# Patient Record
Sex: Female | Born: 1990 | Hispanic: Yes | Marital: Single
Health system: Midwestern US, Community
[De-identification: ages and names within clinical notes are randomized; demographics above are authoritative.]

## PROBLEM LIST (undated history)

## (undated) DIAGNOSIS — F329 Major depressive disorder, single episode, unspecified: Secondary | ICD-10-CM

## (undated) DIAGNOSIS — F419 Anxiety disorder, unspecified: Secondary | ICD-10-CM

## (undated) DIAGNOSIS — F319 Bipolar disorder, unspecified: Secondary | ICD-10-CM

## (undated) DIAGNOSIS — F32A Depression, unspecified: Secondary | ICD-10-CM

## (undated) HISTORY — DX: Depression, unspecified: F32.A

## (undated) HISTORY — PX: TONSILLECTOMY AND ADENOIDECTOMY: SHX28

## (undated) HISTORY — DX: Major depressive disorder, single episode, unspecified: F32.9

---
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
## 2009-11-17 ENCOUNTER — Ambulatory Visit: Payer: Self-pay | Admitting: Internal Medicine

## 2009-11-17 DIAGNOSIS — F329 Major depressive disorder, single episode, unspecified: Secondary | ICD-10-CM

## 2009-11-17 DIAGNOSIS — F3289 Other specified depressive episodes: Secondary | ICD-10-CM | POA: Insufficient documentation

## 2009-11-21 LAB — CONVERTED CEMR LAB
ALT: 12 units/L (ref 0–35)
AST: 16 units/L (ref 0–37)
Albumin: 4.2 g/dL (ref 3.5–5.2)
Alkaline Phosphatase: 80 units/L (ref 39–117)
BUN: 12 mg/dL (ref 6–23)
Basophils Absolute: 0 10*3/uL (ref 0.0–0.1)
Basophils Relative: 0.3 % (ref 0.0–3.0)
Bilirubin, Direct: 0.1 mg/dL (ref 0.0–0.3)
CO2: 29 meq/L (ref 19–32)
Calcium: 9.6 mg/dL (ref 8.4–10.5)
Chloride: 111 meq/L (ref 96–112)
Creatinine, Ser: 0.7 mg/dL (ref 0.4–1.2)
Eosinophils Absolute: 0.1 10*3/uL (ref 0.0–0.7)
Eosinophils Relative: 0.9 % (ref 0.0–5.0)
GFR calc non Af Amer: 108.86 mL/min (ref >60)
Glucose, Bld: 85 mg/dL (ref 70–99)
HCT: 38.1 % (ref 36.0–46.0)
Hemoglobin: 12.9 g/dL (ref 12.0–15.0)
Lymphocytes Relative: 26.4 % (ref 12.0–46.0)
Lymphs Abs: 1.9 10*3/uL (ref 0.7–4.0)
MCHC: 33.8 g/dL (ref 30.0–36.0)
MCV: 87.6 fL (ref 78.0–100.0)
Monocytes Absolute: 0.7 10*3/uL (ref 0.1–1.0)
Monocytes Relative: 9.1 % (ref 3.0–12.0)
Neutro Abs: 4.5 10*3/uL (ref 1.4–7.7)
Neutrophils Relative %: 63.3 % (ref 43.0–77.0)
Phosphorus: 3.4 mg/dL (ref 2.3–4.6)
Platelets: 290 10*3/uL (ref 150.0–400.0)
Potassium: 4.5 meq/L (ref 3.5–5.1)
RBC: 4.35 M/uL (ref 3.87–5.11)
RDW: 13.9 % (ref 11.5–14.6)
Sodium: 144 meq/L (ref 135–145)
TSH: 0.75 microintl units/mL (ref 0.35–5.50)
Total Bilirubin: 0.7 mg/dL (ref 0.3–1.2)
Total Protein: 7.1 g/dL (ref 6.0–8.3)
WBC: 7.1 10*3/uL (ref 4.5–10.5)

## 2009-11-30 ENCOUNTER — Ambulatory Visit: Payer: Self-pay | Admitting: Psychology

## 2009-12-07 ENCOUNTER — Ambulatory Visit: Payer: Self-pay | Admitting: Psychology

## 2009-12-14 ENCOUNTER — Ambulatory Visit: Payer: Self-pay | Admitting: Psychology

## 2009-12-14 ENCOUNTER — Encounter: Payer: Self-pay | Admitting: Internal Medicine

## 2009-12-19 ENCOUNTER — Telehealth: Payer: Self-pay | Admitting: Internal Medicine

## 2009-12-23 ENCOUNTER — Ambulatory Visit: Payer: Self-pay | Admitting: Internal Medicine

## 2009-12-27 ENCOUNTER — Ambulatory Visit: Payer: Self-pay | Admitting: Psychology

## 2010-01-17 ENCOUNTER — Ambulatory Visit: Payer: Self-pay | Admitting: Psychology

## 2010-01-19 ENCOUNTER — Ambulatory Visit: Payer: Self-pay | Admitting: Internal Medicine

## 2010-01-19 DIAGNOSIS — F319 Bipolar disorder, unspecified: Secondary | ICD-10-CM

## 2010-02-07 ENCOUNTER — Ambulatory Visit: Payer: Self-pay | Admitting: Psychology

## 2010-02-17 ENCOUNTER — Telehealth: Payer: Self-pay | Admitting: Internal Medicine

## 2010-06-13 NOTE — Assessment & Plan Note (Signed)
Summary: DEPRESSION/CLE   Vital Signs:  Patient profile:   20 year old female Height:      66 inches Weight:      176 pounds BMI:     28.51 Temp:     98.4 degrees F oral Pulse rate:   76 / minute Pulse rhythm:   regular BP sitting:   120 / 70  (left arm) Cuff size:   regular  Vitals Entered By: Linde Gillis CMA Duncan Dull) (November 17, 2009 4:19 PM) CC: depression   History of Present Illness: Recently returned from [place] serving with the [employer] discharge for medical causes Depression, anxiety and suicide attempt Slashed wrists 3 months ago Had gotten broken up wtih over Facebook while in [place] No Rx given then  No active suicidal ideation Still with depressed mood but not every day---  ~50% of the time Has been looking for a job but no success Living with grandparents  Last anxiety attack was when she returned home about 2 months ago no recent panic or anxiety  some degree of anhedonia occ hangs out with friends No drugs no alcohol  Allergies (verified): No Known Drug Allergies  Past History:  Past Medical History: Depression  Social History: Current Smoker--discussed No alcohol Brief stint in the [employer]--- discharged with depression  Review of Systems       having sleep problems--only about 4 hours per night only eats 1 meal per day--no appetite weight up and down but essentially unchanged some helpless and hopeless feelings some memory problems and trouble focusing  Physical Exam  General:  alert.  NAD Psych:  not anxious appearing, not suicidal, depressed affect, flat affect, and subdued.   Normal thought processes. No hallucinations or delusions Appropriate appearance   Impression & Recommendations:  Problem # 1:  DEPRESSION (ICD-311) Assessment New  seems to have had a reactive depression---serviing overseas then relationship ended and suicide attempt now with protean symptoms though not daily depression probably major  depression  will start low dose fluoxetine begin counselling close follow up discussed risk of suicidal ideation starting meds  Her updated medication list for this problem includes:    Fluoxetine Hcl 10 Mg Caps (Fluoxetine hcl) .Marland Kitchen... 1 daily for depressed mood  Orders: Venipuncture ([address]) TLB-Renal Function Panel (80069-RENAL) TLB-CBC Platelet - w/Differential (85025-CBCD) TLB-Hepatic/Liver Function Pnl (80076-HEPATIC) TLB-TSH (Thyroid Stimulating Hormone) (60454-UJW) Psychology Referral (Psychology)  Complete Medication List: 1)  Fluoxetine Hcl 10 Mg Caps (Fluoxetine hcl) .Marland Kitchen.. 1 daily for depressed mood  Patient Instructions: 1)  Please schedule a follow-up appointment in 2-3  weeks.  2)  Please set up appt with Dr Laymond Purser Prescriptions: FLUOXETINE HCL 10 MG CAPS (FLUOXETINE HCL) 1 daily for depressed mood  #30 x 11   Entered and Authorized by:   Cindee Salt MD   Signed by:   Cindee Salt MD on 11/17/2009   Method used:   Electronically to        [id number]       [address]       [place], [place]  [address]       Ph: [phone]       Fax: [phone]   RxID:   [id number]   Prior Medications (reviewed today): None Current Allergies (reviewed today): No known allergies

## 2010-06-13 NOTE — Progress Notes (Signed)
Summary: refill request for depakote  Phone Note Refill Request Call back at Home Phone [phone] Message from:  Patient  Refills Requested: Medication #1:  DIVALPROEX SODIUM 500 MG TBEC 1 tab by mouth two times a day to stabilize mood. Pt is asking for a 90 day supply to be sent to [facility].  She is ok to wait until next wednesday when you are back.  Initial call taken by: Lowella Petties CMA,  February 17, 2010 2:52 PM  Follow-up for Phone Call        okay to send #180 x 1 Follow-up by: Cindee Salt MD,  February 20, 2010 8:50 AM  Additional Follow-up for Phone Call Additional follow up Details #1::        Rx faxed to pharmacy Additional Follow-up by: DeShannon Smith CMA Duncan Dull),  February 20, 2010 9:31 AM    Prescriptions: DIVALPROEX SODIUM 500 MG TBEC (DIVALPROEX SODIUM) 1 tab by mouth two times a day to stabilize mood  #180 x 1   Entered by:   Mervin Hack CMA (AAMA)   Authorized by:   Cindee Salt MD   Signed by:   Mervin Hack CMA (AAMA) on 02/20/2010   Method used:   Electronically to        [id number]       [address]       [place], [place]  [address]       Ph: [phone]       Fax: [phone]   RxID:   [id number]

## 2010-06-13 NOTE — Letter (Signed)
Summary: E-Mail from Dr.Jane Rudean Curt from Dr.Jane [place]   Imported By: Beau Fanny 12/27/2009 13:49:15  _____________________________________________________________________  External Attachment:    Type:   Image     Comment:   External Document

## 2010-06-13 NOTE — Progress Notes (Signed)
  Phone Note Other Incoming   Caller: Dr Laymond Purser Summary of Call: E-mail that indicates her concern for bipolar disorder Flight of ideas, manic behaviors with spending, in past Now not sleeping, pressured speech and increased goal-oriented activity and noted mood swings by family members  will review this at her follow up consider mood stabilizer or psychiatric referral Initial call taken by: Cindee Salt MD,  December 19, 2009 10:12 AM

## 2010-06-13 NOTE — Assessment & Plan Note (Signed)
Summary: ROA FOR 2-3 WEEK FOLLOW-UP/JRR   Vital Signs:  Patient profile:   20 year old female Weight:      184 pounds Temp:     98.4 degrees F oral Pulse rate:   88 / minute Pulse rhythm:   regular BP sitting:   108 / 68  (left arm) Cuff size:   regular  Vitals Entered By: Mervin Hack CMA Duncan Dull) (January 19, 2010 4:30 PM) CC: follow-up visit   History of Present Illness: DOing better Has noticed an intensification of her irritability Got into argument in public  Doesn't feel depressed No suicidal ideation now  No side effects with the valproic acid  still seeing Dr Laymond Purser  Allergies: No Known Drug Allergies  Past History:  Past medical, surgical, family and social histories (including risk factors) reviewed for relevance to current acute and chronic problems.  Past Medical History: Depression--Bipolar  Past Surgical History: Reviewed history from 08/14/2006 and no changes required. T&A  age 89  Family History: Reviewed history and no changes required.  Social History: Reviewed history from 11/17/2009 and no changes required. Current Smoker--discussed No alcohol Brief stint in the [employer]--- discharged with depression  Review of Systems       sleeping fairly well appetite still not great but is improving weight is up 4#  Physical Exam  General:  alert and normal appearance.   Psych:  normally interactive, good eye contact, not anxious appearing, and not depressed appearing.     Impression & Recommendations:  Problem # 1:  BIPOLAR DISORDER UNSPECIFIED (ICD-296.80) Assessment Improved depression is basically gone sleeping better now still with some irritability but no mania will increase the depakote and keep close follow up  Complete Medication List: 1)  Fluoxetine Hcl 10 Mg Caps (Fluoxetine hcl) .Marland Kitchen.. 1 daily for depressed mood 2)  Divalproex Sodium 500 Mg Tbec (Divalproex sodium) .Marland Kitchen.. 1 tab by mouth two times a day to stabilize  mood  Patient Instructions: 1)  Please schedule a follow-up appointment in 3-4 weeks.  Prescriptions: DIVALPROEX SODIUM 500 MG TBEC (DIVALPROEX SODIUM) 1 tab by mouth two times a day to stabilize mood  #60 x 1   Entered and Authorized by:   Cindee Salt MD   Signed by:   Cindee Salt MD on 01/19/2010   Method used:   Electronically to        [id number]       [address]       [place], [place]  [address]       Ph: [phone]       Fax: [phone]   RxID:   [id number]   Current Allergies (reviewed today): No known allergies

## 2010-06-13 NOTE — Assessment & Plan Note (Signed)
Summary: follow up/alc   Vital Signs:  Patient profile:   20 year old female Weight:      180 pounds Temp:     98.3 degrees F oral Pulse rate:   72 / minute Pulse rhythm:   regular BP sitting:   110 / 60  (left arm) Cuff size:   regular  Vitals Entered By: Mervin Hack CMA Duncan Dull) (December 23, 2009 2:09 PM) CC: follow-up visit   History of Present Illness: Feels a little better still having troubles sleeping  Discussed issues about possible bipolar disorder she feels these behaviors went back to her teenage years and were even worse then She is better at controlling her mood swings now  Noticed some more irritability since on the fluoxetine Feels her sleep problems are about the same  No suicidal thoughts now None when just starting the fluoxetine    Allergies: No Known Drug Allergies  Past History:  Past medical, surgical, family and social histories (including risk factors) reviewed for relevance to current acute and chronic problems.  Past Medical History: Reviewed history from 11/17/2009 and no changes required. Depression  Past Surgical History: Reviewed history from 08/14/2006 and no changes required. T&A  age 45  Family History: Reviewed history and no changes required.  Social History: Reviewed history from 11/17/2009 and no changes required. Current Smoker--discussed No alcohol Brief stint in the [employer]--- discharged with depression  Review of Systems       still eating okay weight slightly up  Physical Exam  General:  alert and normal appearance.   Psych:  normally interactive, good eye contact, not anxious appearing, and not depressed appearing.     Impression & Recommendations:  Problem # 1:  DEPRESSION (ICD-311) Assessment Comment Only reviewing history and Dr Perrin's observations she seems to be bipolar the "irritablilty" goes along with this Mom had noticed signs of this and actually had been checking into this on her  own  Discussed options of me starting treatment vs psychiatrist I will try depakote continue fluoxetine for now at least Psychiatrist if complications or fails to respond consider lithium or atypical antipsychotic  Her updated medication list for this problem includes:    Fluoxetine Hcl 10 Mg Caps (Fluoxetine hcl) .Marland Kitchen... 1 daily for depressed mood  Complete Medication List: 1)  Fluoxetine Hcl 10 Mg Caps (Fluoxetine hcl) .Marland Kitchen.. 1 daily for depressed mood 2)  Valproic Acid 250 Mg Caps (Valproic acid) .Marland Kitchen.. 1 tab by mouth two times a day for mood stabilization  Patient Instructions: 1)  Please schedule a follow-up appointment in 2-3 weeks.  Prescriptions: VALPROIC ACID 250 MG CAPS (VALPROIC ACID) 1 tab by mouth two times a day for mood stabilization  #60 x 1   Entered and Authorized by:   Cindee Salt MD   Signed by:   Cindee Salt MD on 12/23/2009   Method used:   Electronically to        [id number]       [address]       [place], [place]  [address]       Ph: [phone]       Fax: [phone]   RxID:   [id number]   Current Allergies (reviewed today): No known allergies   Prevention & Chronic Care Immunizations   Influenza vaccine: Not documented    Tetanus booster: Not documented    Pneumococcal vaccine: Not documented  Other Screening   Pap smear: Not documented  Smoking status: current  (06/06/2007)

## 2010-08-01 ENCOUNTER — Encounter: Payer: Self-pay | Admitting: Internal Medicine

## 2011-01-19 ENCOUNTER — Ambulatory Visit (INDEPENDENT_AMBULATORY_CARE_PROVIDER_SITE_OTHER): Payer: Self-pay | Admitting: Internal Medicine

## 2011-01-19 ENCOUNTER — Encounter: Payer: Self-pay | Admitting: Internal Medicine

## 2011-01-19 VITALS — BP 124/76 | HR 92 | Temp 98.1°F | Ht 66.0 in | Wt 171.8 lb

## 2011-01-19 DIAGNOSIS — F319 Bipolar disorder, unspecified: Secondary | ICD-10-CM

## 2011-01-19 MED ORDER — LITHIUM CARBONATE 300 MG PO CAPS: 300.0000 mg | ORAL_CAPSULE | Freq: Two times a day (BID) | ORAL | Status: AC

## 2011-01-19 NOTE — Assessment & Plan Note (Signed)
Did well with the depakote and fluoxetine but can't afford. Can't afford anything not on $4 list Will try lithium since that is better choice than the old antipsychotics on list Only other option is tegretol which is not great as mood stabilizer

## 2011-01-19 NOTE — Progress Notes (Signed)
  Subjective:    Patient ID: Rachel Sampson, female    DOB: 1990-06-22, 20 y.o.   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 divalproex (DEPAKOTE) 500 MG EC tablet Take 500 mg by mouth 2 (two) times daily. To stabilize mood       . FLUoxetine (PROZAC) 10 MG tablet Take 10 mg by mouth daily. Depressive mood         No Known Allergies  Past Medical History  Diagnosis Date  . Depression     Bipolar    Past Surgical History  Procedure Date  . Tonsillectomy and adenoidectomy age 81    No family history on file.  History   Social History  . Marital Status: Single    Spouse Name: N/A    Number of Children: N/A  . Years of Education: N/A   Occupational History  . Not on file.   Social History Main Topics  . Smoking status: Current Everyday Smoker  . Smokeless tobacco: Not on file   Comment: discussed  . Alcohol Use: No  . Drug Use: Not on file  . Sexually Active: Not on file   Other Topics Concern  . Not on file   Social History Narrative   Brief stint in the [employer]- discharged with depression   Review of Systems Sleeping okay Appetite is not good    Objective:   Physical Exam  Constitutional: She appears well-developed and well-nourished. No distress.  Psychiatric:       Neutral mood No thought process disturbances          Assessment & Plan:

## 2011-02-21 ENCOUNTER — Ambulatory Visit: Payer: Self-pay | Admitting: Internal Medicine

## 2011-02-28 ENCOUNTER — Ambulatory Visit (INDEPENDENT_AMBULATORY_CARE_PROVIDER_SITE_OTHER): Payer: Self-pay | Admitting: Internal Medicine

## 2011-02-28 ENCOUNTER — Encounter: Payer: Self-pay | Admitting: Internal Medicine

## 2011-02-28 VITALS — BP 109/63 | HR 86 | Temp 98.1°F | Ht 66.0 in | Wt 171.0 lb

## 2011-02-28 DIAGNOSIS — F319 Bipolar disorder, unspecified: Secondary | ICD-10-CM

## 2011-02-28 LAB — BASIC METABOLIC PANEL
Chloride: 108 mEq/L (ref 96–112)
Creatinine, Ser: 0.7 mg/dL (ref 0.4–1.2)
Sodium: 140 mEq/L (ref 135–145)

## 2011-02-28 LAB — TSH: TSH: 0.77 u[IU]/mL (ref 0.35–5.50)

## 2011-02-28 LAB — T4, FREE: Free T4: 0.73 ng/dL (ref 0.60–1.60)

## 2011-02-28 NOTE — Assessment & Plan Note (Signed)
Is better The depression is mostly gone She notices a decrease in manic behavior (like spending too much) Not as irritable (but friends note personality change---this may be expected on the lithium)  Will continue Hopefully can get more input from family before next visit

## 2011-02-28 NOTE — Progress Notes (Signed)
  Subjective:    Patient ID: Rachel Sampson, female    DOB: 1990/08/12, 20 y.o.   MRN: [id number]  HPI Doing okay No problems with the medication Friends have noted that it changes her personality---they feel she is more irritable She thinks things are better Hasn't lost temper Hasn't really felt depressed since starting the med  Sleep is still not great Will get 5 hours per night Spending is more controlled  Current Outpatient Prescriptions on File Prior to Visit  Medication Sig Dispense Refill  . lithium carbonate 300 MG capsule Take 1 capsule (300 mg total) by mouth 2 (two) times daily with a meal.  60 capsule  3    No Known Allergies  Past Medical History  Diagnosis Date  . Depression     Bipolar    Past Surgical History  Procedure Date  . Tonsillectomy and adenoidectomy age 2    No family history on file.  History   Social History  . Marital Status: Single    Spouse Name: N/A    Number of Children: N/A  . Years of Education: N/A   Occupational History  . Not on file.   Social History Main Topics  . Smoking status: Current Everyday Smoker  . Smokeless tobacco: Never Used   Comment: discussed  . Alcohol Use: No  . Drug Use: Not on file  . Sexually Active: Not on file   Other Topics Concern  . Not on file   Social History Narrative   Brief stint in the [employer]- discharged with depression     Review of Systems Appetite isn't great---usually only a meal a day Weight is stable    Objective:   Physical Exam  Constitutional: She appears well-developed and well-nourished. No distress.  Psychiatric: She has a normal mood and affect. Her behavior is normal. Judgment and thought content normal.          Assessment & Plan:

## 2011-03-01 LAB — LITHIUM LEVEL: Lithium Lvl: 0.01 mEq/L — ABNORMAL LOW (ref 0.80–1.40)

## 2011-05-31 ENCOUNTER — Ambulatory Visit: Payer: Self-pay | Admitting: Internal Medicine

## 2011-05-31 DIAGNOSIS — Z0289 Encounter for other administrative examinations: Secondary | ICD-10-CM

## 2012-11-11 LAB — DRUG SCREEN, URINE
Barbiturates, Ur Screen: NEGATIVE (ref ?–200)
MDMA (Ecstasy)Ur Screen: NEGATIVE (ref ?–500)
Methadone, Ur Screen: NEGATIVE (ref ?–300)
Opiate, Ur Screen: NEGATIVE (ref ?–300)
Phencyclidine (PCP) Ur S: NEGATIVE (ref ?–25)
Tricyclic, Ur Screen: NEGATIVE (ref ?–1000)

## 2012-11-11 LAB — ETHANOL: Ethanol %: <0.003 % (ref 0.000–0.080)

## 2012-11-11 LAB — COMPREHENSIVE METABOLIC PANEL
Albumin: 4.5 g/dL (ref 3.4–5.0)
BUN: 12 mg/dL (ref 7–18)
Bilirubin,Total: 0.4 mg/dL (ref 0.2–1.0)
Calcium, Total: 9.8 mg/dL (ref 8.5–10.1)
Chloride: 109 mmol/L — ABNORMAL HIGH (ref 98–107)
Co2: 28 mmol/L (ref 21–32)
EGFR (African American): >60
EGFR (Non-African Amer.): >60
Glucose: 85 mg/dL (ref 65–99)
Osmolality: 282 (ref 275–301)
SGPT (ALT): 14 U/L (ref 12–78)
Sodium: 142 mmol/L (ref 136–145)

## 2012-11-11 LAB — URINALYSIS, COMPLETE
Blood: NEGATIVE
Leukocyte Esterase: NEGATIVE
Nitrite: NEGATIVE
Ph: 6 (ref 4.5–8.0)
Protein: 30
RBC,UR: <1 /HPF (ref 0–5)
Squamous Epithelial: 1
WBC UR: 1 /HPF (ref 0–5)

## 2012-11-11 LAB — CBC
HCT: 38.6 % (ref 35.0–47.0)
HGB: 13.7 g/dL (ref 12.0–16.0)
MCV: 85 fL (ref 80–100)
RDW: 13.7 % (ref 11.5–14.5)
WBC: 7.5 10*3/uL (ref 3.6–11.0)

## 2012-11-11 LAB — PREGNANCY, URINE: Pregnancy Test, Urine: NEGATIVE m[IU]/mL

## 2012-11-11 LAB — ACETAMINOPHEN LEVEL: Acetaminophen: <2 ug/mL

## 2012-11-11 LAB — SALICYLATE LEVEL: Salicylates, Serum: 2 mg/dL

## 2012-11-12 ENCOUNTER — Inpatient Hospital Stay: Payer: Self-pay | Admitting: Psychiatry

## 2012-11-13 LAB — BEHAVIORAL MEDICINE 1 PANEL
Albumin: 4 g/dL (ref 3.4–5.0)
Alkaline Phosphatase: 65 U/L (ref 50–136)
BUN: 11 mg/dL (ref 7–18)
Basophil %: 0.8 %
Chloride: 108 mmol/L — ABNORMAL HIGH (ref 98–107)
Creatinine: 0.57 mg/dL — ABNORMAL LOW (ref 0.60–1.30)
Glucose: 65 mg/dL (ref 65–99)
HCT: 36.5 % (ref 35.0–47.0)
Lymphocyte #: 3 10*3/uL (ref 1.0–3.6)
Lymphocyte %: 42.5 %
MCV: 85 fL (ref 80–100)
Monocyte #: 0.6 x10 3/mm (ref 0.2–0.9)
Monocyte %: 9.1 %
Neutrophil #: 3.2 10*3/uL (ref 1.4–6.5)
Neutrophil %: 45.4 %
Osmolality: 281 (ref 275–301)
Platelet: 259 10*3/uL (ref 150–440)
RBC: 4.32 10*6/uL (ref 3.80–5.20)
RDW: 13.6 % (ref 11.5–14.5)
Sodium: 142 mmol/L (ref 136–145)
Thyroid Stimulating Horm: 1.07 u[IU]/mL
Total Protein: 7.1 g/dL (ref 6.4–8.2)
WBC: 7 10*3/uL (ref 3.6–11.0)

## 2012-11-14 LAB — URINALYSIS, COMPLETE
Glucose,UR: NEGATIVE mg/dL (ref 0–75)
Leukocyte Esterase: NEGATIVE
Protein: NEGATIVE
RBC,UR: NONE SEEN /HPF (ref 0–5)
Specific Gravity: 1.019 (ref 1.003–1.030)
WBC UR: 2 /HPF (ref 0–5)

## 2013-07-20 ENCOUNTER — Telehealth: Payer: Self-pay | Admitting: Internal Medicine

## 2013-07-20 ENCOUNTER — Emergency Department: Payer: Self-pay | Admitting: Emergency Medicine

## 2013-07-20 LAB — URINALYSIS, COMPLETE
BACTERIA: NONE SEEN
BLOOD: NEGATIVE
Bilirubin,UR: NEGATIVE
Glucose,UR: NEGATIVE mg/dL (ref 0–75)
LEUKOCYTE ESTERASE: NEGATIVE
Nitrite: NEGATIVE
Ph: 5 (ref 4.5–8.0)
Protein: 30
SPECIFIC GRAVITY: 1.03 (ref 1.003–1.030)
Squamous Epithelial: NONE SEEN
WBC UR: <1 /HPF (ref 0–5)

## 2013-07-20 LAB — CBC
HCT: 38.2 % (ref 35.0–47.0)
HGB: 13 g/dL (ref 12.0–16.0)
MCH: 29.6 pg (ref 26.0–34.0)
MCHC: 34 g/dL (ref 32.0–36.0)
MCV: 87 fL (ref 80–100)
Platelet: 277 10*3/uL (ref 150–440)
RBC: 4.38 10*6/uL (ref 3.80–5.20)
RDW: 13.8 % (ref 11.5–14.5)
WBC: 6.6 10*3/uL (ref 3.6–11.0)

## 2013-07-20 LAB — COMPREHENSIVE METABOLIC PANEL
ALBUMIN: 4.4 g/dL (ref 3.4–5.0)
ALT: 14 U/L (ref 12–78)
ANION GAP: 4 — AB (ref 7–16)
AST: 16 U/L (ref 15–37)
Alkaline Phosphatase: 60 U/L
BUN: 9 mg/dL (ref 7–18)
Bilirubin,Total: 0.4 mg/dL (ref 0.2–1.0)
CHLORIDE: 107 mmol/L (ref 98–107)
CO2: 28 mmol/L (ref 21–32)
Calcium, Total: 8.9 mg/dL (ref 8.5–10.1)
Creatinine: 0.71 mg/dL (ref 0.60–1.30)
EGFR (Non-African Amer.): >60
Glucose: 71 mg/dL (ref 65–99)
Osmolality: 275 (ref 275–301)
POTASSIUM: 3 mmol/L — AB (ref 3.5–5.1)
Sodium: 139 mmol/L (ref 136–145)
Total Protein: 7.6 g/dL (ref 6.4–8.2)

## 2013-07-20 LAB — ETHANOL

## 2013-07-20 LAB — SALICYLATE LEVEL: Salicylates, Serum: 2.8 mg/dL

## 2013-07-20 LAB — DRUG SCREEN, URINE
Amphetamines, Ur Screen: NEGATIVE (ref ?–1000)
Barbiturates, Ur Screen: NEGATIVE (ref ?–200)
Benzodiazepine, Ur Scrn: POSITIVE (ref ?–200)
CANNABINOID 50 NG, UR ~~LOC~~: POSITIVE (ref ?–50)
COCAINE METABOLITE, UR ~~LOC~~: NEGATIVE (ref ?–300)
MDMA (Ecstasy)Ur Screen: NEGATIVE (ref ?–500)
Methadone, Ur Screen: NEGATIVE (ref ?–300)
Opiate, Ur Screen: NEGATIVE (ref ?–300)
Phencyclidine (PCP) Ur S: NEGATIVE (ref ?–25)
Tricyclic, Ur Screen: NEGATIVE (ref ?–1000)

## 2013-07-20 LAB — ACETAMINOPHEN LEVEL: Acetaminophen: <2 ug/mL

## 2013-07-20 NOTE — Telephone Encounter (Signed)
Pt wants to switch providers from Dr Alphonsus SiasLetvak to Dr Dayton MartesAron. Pt states mom sees Dr Dayton MartesAron and would like to see her also. Pt requesting to make appt for medication refill. Please advise if Dr Dayton MartesAron

## 2013-07-22 NOTE — Telephone Encounter (Signed)
Ok with me if ok with PCP 

## 2013-07-22 NOTE — Telephone Encounter (Signed)
Okay with me 

## 2013-08-09 ENCOUNTER — Encounter (HOSPITAL_COMMUNITY): Payer: Self-pay | Admitting: Emergency Medicine

## 2013-08-09 ENCOUNTER — Emergency Department (HOSPITAL_COMMUNITY)
Admission: EM | Admit: 2013-08-09 | Discharge: 2013-08-09 | Disposition: A | Discharge status: Home / Self Care | Payer: BC Managed Care – PPO | Attending: Emergency Medicine | Admitting: Emergency Medicine

## 2013-08-09 DIAGNOSIS — Y929 Unspecified place or not applicable: Secondary | ICD-10-CM | POA: Insufficient documentation

## 2013-08-09 DIAGNOSIS — Z79899 Other long term (current) drug therapy: Secondary | ICD-10-CM | POA: Insufficient documentation

## 2013-08-09 DIAGNOSIS — F329 Major depressive disorder, single episode, unspecified: Secondary | ICD-10-CM

## 2013-08-09 DIAGNOSIS — Y939 Activity, unspecified: Secondary | ICD-10-CM | POA: Insufficient documentation

## 2013-08-09 DIAGNOSIS — F319 Bipolar disorder, unspecified: Secondary | ICD-10-CM | POA: Insufficient documentation

## 2013-08-09 DIAGNOSIS — Y289XXA Contact with unspecified sharp object, undetermined intent, initial encounter: Secondary | ICD-10-CM | POA: Insufficient documentation

## 2013-08-09 DIAGNOSIS — Z3202 Encounter for pregnancy test, result negative: Secondary | ICD-10-CM | POA: Insufficient documentation

## 2013-08-09 DIAGNOSIS — F489 Nonpsychotic mental disorder, unspecified: Secondary | ICD-10-CM

## 2013-08-09 DIAGNOSIS — F3289 Other specified depressive episodes: Secondary | ICD-10-CM

## 2013-08-09 DIAGNOSIS — F411 Generalized anxiety disorder: Secondary | ICD-10-CM | POA: Insufficient documentation

## 2013-08-09 DIAGNOSIS — F17200 Nicotine dependence, unspecified, uncomplicated: Secondary | ICD-10-CM | POA: Insufficient documentation

## 2013-08-09 HISTORY — DX: Anxiety disorder, unspecified: F41.9

## 2013-08-09 HISTORY — DX: Bipolar disorder, unspecified: F31.9

## 2013-08-09 LAB — ACETAMINOPHEN LEVEL: Acetaminophen (Tylenol), Serum: <15 ug/mL (ref 10–30)

## 2013-08-09 LAB — CBC
HCT: 37.9 % (ref 36.0–46.0)
HEMOGLOBIN: 13.1 g/dL (ref 12.0–15.0)
MCH: 29.7 pg (ref 26.0–34.0)
MCHC: 34.6 g/dL (ref 30.0–36.0)
MCV: 85.9 fL (ref 78.0–100.0)
PLATELETS: 190 10*3/uL (ref 150–400)
RBC: 4.41 MIL/uL (ref 3.87–5.11)
RDW: 13.4 % (ref 11.5–15.5)
WBC: 7.4 10*3/uL (ref 4.0–10.5)

## 2013-08-09 LAB — URINALYSIS, ROUTINE W REFLEX MICROSCOPIC
Glucose, UA: NEGATIVE mg/dL
HGB URINE DIPSTICK: NEGATIVE
Ketones, ur: NEGATIVE mg/dL
Leukocytes, UA: NEGATIVE
Nitrite: NEGATIVE
PROTEIN: 30 mg/dL — AB
Specific Gravity, Urine: 1.036 — ABNORMAL HIGH (ref 1.005–1.030)
Urobilinogen, UA: 1 mg/dL (ref 0.0–1.0)
pH: 6 (ref 5.0–8.0)

## 2013-08-09 LAB — RAPID URINE DRUG SCREEN, HOSP PERFORMED
AMPHETAMINES: POSITIVE — AB
Barbiturates: NOT DETECTED
Benzodiazepines: POSITIVE — AB
Cocaine: NOT DETECTED
OPIATES: NOT DETECTED
TETRAHYDROCANNABINOL: POSITIVE — AB

## 2013-08-09 LAB — COMPREHENSIVE METABOLIC PANEL
ALK PHOS: 51 U/L (ref 39–117)
ALT: 7 U/L (ref 0–35)
AST: 11 U/L (ref 0–37)
Albumin: 4.6 g/dL (ref 3.5–5.2)
BILIRUBIN TOTAL: 0.3 mg/dL (ref 0.3–1.2)
BUN: 10 mg/dL (ref 6–23)
CALCIUM: 9.9 mg/dL (ref 8.4–10.5)
CO2: 22 meq/L (ref 19–32)
Chloride: 102 mEq/L (ref 96–112)
Creatinine, Ser: 0.7 mg/dL (ref 0.50–1.10)
GFR calc non Af Amer: >90 mL/min (ref >90)
GLUCOSE: 114 mg/dL — AB (ref 70–99)
POTASSIUM: 3.3 meq/L — AB (ref 3.7–5.3)
Sodium: 140 mEq/L (ref 137–147)
Total Protein: 7.6 g/dL (ref 6.0–8.3)

## 2013-08-09 LAB — URINE MICROSCOPIC-ADD ON

## 2013-08-09 LAB — POC URINE PREG, ED: PREG TEST UR: NEGATIVE

## 2013-08-09 LAB — ETHANOL: Alcohol, Ethyl (B): <11 mg/dL (ref 0–11)

## 2013-08-09 LAB — SALICYLATE LEVEL: Salicylate Lvl: <2 mg/dL — ABNORMAL LOW (ref 2.8–20.0)

## 2013-08-09 MED ORDER — ZOLPIDEM TARTRATE 5 MG PO TABS: 5.0000 mg | ORAL_TABLET | Freq: Every evening | ORAL | Status: DC | PRN

## 2013-08-09 MED ORDER — CEPHALEXIN 500 MG PO CAPS
500.0000 mg | ORAL_CAPSULE | Freq: Four times a day (QID) | ORAL | Status: DC
  Administered 2013-08-09 (×2): 500 mg via ORAL
  Filled 2013-08-09 (×2): qty 1

## 2013-08-09 MED ORDER — NICOTINE 21 MG/24HR TD PT24: 21.0000 mg | MEDICATED_PATCH | Freq: Every day | TRANSDERMAL | Status: DC

## 2013-08-09 MED ORDER — IBUPROFEN 200 MG PO TABS: 600.0000 mg | ORAL_TABLET | Freq: Three times a day (TID) | ORAL | Status: DC | PRN

## 2013-08-09 MED ORDER — ONDANSETRON HCL 4 MG PO TABS: 4.0000 mg | ORAL_TABLET | Freq: Three times a day (TID) | ORAL | Status: DC | PRN

## 2013-08-09 NOTE — ED Notes (Signed)
Pt reports she has been having increasing amount of arguments with her girlfriend. Yesterday morning she "got carried away" when cutting and cut very superficially over 100 times to bilateral arms, chest and abdomen. Sts she could not contact her girlfriend and was worried about the situation the girlfriend was in regarding the people she was with and [facility] [place] to find GF. Police arrived at the same time she did, which makes her believe she was "set up" and GF told police she was a cutter and showed them pictures of her cuts and police "detained" her and brought her to ED for evaluation. Pt denies SI/HI. Sts she cuts to relieve stress and "got carried away" yesterday due to increasing stress with GF.
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
## 2013-08-09 NOTE — ED Notes (Signed)
Bed: WA28 Expected date:  Expected time:  Means of arrival:  Comments: TCU - bed 

## 2013-08-09 NOTE — ED Notes (Signed)
Bed: [id number] Expected date:  Expected time:  Means of arrival:  Comments: EMS 5324 F med clear

## 2013-08-09 NOTE — Discharge Instructions (Signed)
Depression, Adult °Depression is feeling sad, low, down in the dumps, blue, gloomy, or empty. In general, there are two kinds of depression: °· Normal sadness or grief. This can happen after something upsetting. It often goes away on its own within 2 weeks. After losing a loved one (bereavement), normal sadness and grief may last longer than two weeks. It usually gets better with time. °· Clinical depression. This kind lasts longer than normal sadness or grief. It keeps you from doing the things you normally do in life. It is often hard to function at home, work, or at school. It may affect your relationships with others. Treatment is often needed. °GET HELP RIGHT AWAY IF: °· You have thoughts about hurting yourself or others. °· You lose touch with reality (psychotic symptoms). You may: °· See or hear things that are not real. °· Have untrue beliefs about your life or people around you. °· Your medicine is giving you problems. °MAKE SURE YOU: °· Understand these instructions. °· Will watch your condition. °· Will get help right away if you are not doing well or get worse. °Document Released: 06/02/2010 Document Revised: 01/23/2012 Document Reviewed: 08/30/2011 °ExitCare® Patient Information ©2014 [employer]. ° °

## 2013-08-09 NOTE — ED Notes (Signed)
Patient has belongings at the bedside, RN aware.

## 2013-08-09 NOTE — ED Notes (Signed)
TTS at bedside. 
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
## 2013-08-09 NOTE — ED Notes (Signed)
Pt requests neosporin for arm wounds. Bacitracin applied to larger cuts and telfa and kerlix applied to bilateral arms.

## 2013-08-09 NOTE — ED Notes (Signed)
Bed: WA27 Expected date:  Expected time:  Means of arrival:  Comments: TCU - bed
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
## 2013-12-29 ENCOUNTER — Emergency Department (HOSPITAL_COMMUNITY): Payer: BC Managed Care – PPO

## 2013-12-29 ENCOUNTER — Emergency Department (HOSPITAL_COMMUNITY)
Admission: EM | Admit: 2013-12-29 | Discharge: 2013-12-29 | Disposition: A | Discharge status: Home / Self Care | Payer: BC Managed Care – PPO | Attending: Emergency Medicine | Admitting: Emergency Medicine

## 2013-12-29 ENCOUNTER — Encounter (HOSPITAL_COMMUNITY): Payer: Self-pay | Admitting: Emergency Medicine

## 2013-12-29 DIAGNOSIS — IMO0002 Reserved for concepts with insufficient information to code with codable children: Secondary | ICD-10-CM | POA: Insufficient documentation

## 2013-12-29 DIAGNOSIS — F17200 Nicotine dependence, unspecified, uncomplicated: Secondary | ICD-10-CM | POA: Diagnosis not present

## 2013-12-29 DIAGNOSIS — Y92410 Unspecified street and highway as the place of occurrence of the external cause: Secondary | ICD-10-CM | POA: Insufficient documentation

## 2013-12-29 DIAGNOSIS — Z3202 Encounter for pregnancy test, result negative: Secondary | ICD-10-CM | POA: Diagnosis not present

## 2013-12-29 DIAGNOSIS — S0003XA Contusion of scalp, initial encounter: Secondary | ICD-10-CM | POA: Diagnosis not present

## 2013-12-29 DIAGNOSIS — S46909A Unspecified injury of unspecified muscle, fascia and tendon at shoulder and upper arm level, unspecified arm, initial encounter: Secondary | ICD-10-CM | POA: Diagnosis present

## 2013-12-29 DIAGNOSIS — F411 Generalized anxiety disorder: Secondary | ICD-10-CM | POA: Diagnosis not present

## 2013-12-29 DIAGNOSIS — S0083XA Contusion of other part of head, initial encounter: Secondary | ICD-10-CM | POA: Insufficient documentation

## 2013-12-29 DIAGNOSIS — Z79899 Other long term (current) drug therapy: Secondary | ICD-10-CM | POA: Insufficient documentation

## 2013-12-29 DIAGNOSIS — F319 Bipolar disorder, unspecified: Secondary | ICD-10-CM | POA: Diagnosis not present

## 2013-12-29 DIAGNOSIS — S4980XA Other specified injuries of shoulder and upper arm, unspecified arm, initial encounter: Secondary | ICD-10-CM | POA: Insufficient documentation

## 2013-12-29 DIAGNOSIS — Y9389 Activity, other specified: Secondary | ICD-10-CM | POA: Insufficient documentation

## 2013-12-29 DIAGNOSIS — S1093XA Contusion of unspecified part of neck, initial encounter: Secondary | ICD-10-CM

## 2013-12-29 DIAGNOSIS — S40212A Abrasion of left shoulder, initial encounter: Secondary | ICD-10-CM

## 2013-12-29 LAB — POC URINE PREG, ED: PREG TEST UR: NEGATIVE

## 2013-12-29 LAB — RAPID URINE DRUG SCREEN, HOSP PERFORMED
Amphetamines: NOT DETECTED
BENZODIAZEPINES: NOT DETECTED
Barbiturates: NOT DETECTED
Cocaine: NOT DETECTED
Opiates: NOT DETECTED
Tetrahydrocannabinol: POSITIVE — AB

## 2013-12-29 MED ORDER — CYCLOBENZAPRINE HCL 10 MG PO TABS: 10.0000 mg | ORAL_TABLET | Freq: Two times a day (BID) | ORAL | Status: AC | PRN

## 2013-12-29 MED ORDER — TRAMADOL HCL 50 MG PO TABS: 50.0000 mg | ORAL_TABLET | Freq: Four times a day (QID) | ORAL | Status: AC | PRN

## 2013-12-29 MED ORDER — ONDANSETRON HCL 4 MG/2ML IJ SOLN
4.0000 mg | Freq: Once | INTRAMUSCULAR | Status: AC
  Administered 2013-12-29: 4 mg via INTRAVENOUS
  Filled 2013-12-29: qty 2

## 2013-12-29 MED ORDER — FENTANYL CITRATE 0.05 MG/ML IJ SOLN
100.0000 ug | Freq: Once | INTRAMUSCULAR | Status: AC
Start: 2013-12-29 — End: 2013-12-29
  Administered 2013-12-29: 100 ug via INTRAVENOUS
  Filled 2013-12-29: qty 2
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
## 2013-12-29 NOTE — Progress Notes (Signed)
Orthopedic Tech Progress Note Patient Details:  Rachel Sampson 03/17/1991 [id number]  Ortho Devices Type of Ortho Device: Shoulder immobilizer Ortho Device/Splint Interventions: Application   Cammer, Mickie BailJennifer Carol 12/29/2013, 3:00 PM

## 2013-12-29 NOTE — ED Notes (Signed)
Provider at the bedside.  

## 2013-12-30 NOTE — ED Provider Notes (Signed)
  Medical screening examination/treatment/procedure(s) were performed by non-physician practitioner and as supervising physician I was immediately available for consultation/collaboration.   EKG Interpretation None         Gerhard Munchobert Mannat Benedetti, MD 12/30/13 [id number]

## 2014-06-14 ENCOUNTER — Telehealth: Payer: Self-pay | Admitting: *Deleted

## 2014-06-14 NOTE — Telephone Encounter (Signed)
ERROR./CY 

## 2014-09-03 NOTE — Discharge Summary (Signed)
PATIENT NAME:  Rachel Sampson, Rachel Sampson MR#:  [id number] DATE OF BIRTH:  [birth year]  DATE OF ADMISSION:  11/12/2012 DATE OF DISCHARGE:  11/19/2012  CHIEF COMPLAINT: The patient came in after she overdosed on Benadryl.  HISTORY OF PRESENT ILLNESS: The patient is a 24 year old biracial female who has a diagnosis of bipolar disorder. The patient had some relationship issues and states that her girl friend ended the relationship. The patient then overdosed on 8 to 9 Benadryl pills. After she took the pills the patient called her mother who took her to the Emergency Room.   Her mother and grandmother are good support systems for Rachel Sampson.   The patient denies use of alcohol or any other drugs. She has had one previous suicide attempt. Currently, she is not under any care psychiatrically.   HOSPITAL COURSE: The patient was started on Zoloft on 25 mg and trazodone at 50 mg at night to help with her sleep and depression, respectively. She was previously given Depakote and lithium which she reported did not work well for her. Also on further assessment through the course of her hospitalization the patient did not meet any criteria for manic symptoms. The patient endorsed that she has had a lot of anxiety in the past which would cause her to be impulsive and aggressive.  Throughout her hospital stay the patient was compliant with unit activities and attending groups. On July 4th, Friday, the patient wanted to go home and had some acting-out behavior on Saturday, July 5th. However it was discussed with her by our clinicians that the patient needed further stay in the hospital to help her cope with her impulsive thoughts. The patient did come down after that event and participated even more actively in the unit activities. She tolerated her medications well, without any problems.  By July 9th the patient was doing well, without any suicidal thoughts, homicidal thoughts, psychotic symptoms. She developed adequate insight  and was ready for discharge.  MENTAL STATUS EXAM: On discharge the patient was alert and oriented to all spheres. She was neatly groomed in casual clothes. She made good contact and her speech was animated, and she was excited about being discharged. She denied any suicidal or homicidal ideations. She did not have any perceptual disturbances. Fair insight and judgment.   DIAGNOSES ON DISCHARGE:   AXIS I:  Major depressive disorder. AXIS II:  Deferred. AXIS III:  None. AXIS IV: Relationship stressors. AXIS V:  Global Assessment Of Functioning:  75.  DISCHARGE MEDICATIONS: The patient will be discharged on Zoloft at 25 mg once daily, trazodone 50 mg at bedtime as needed for sleep, hydroxyzine 25 mg four times daily for anxiety, and Dulcolax 100 mg twice daily for constipation.   ____________________________ Patrick NorthHimabindu Carnita Golob, MD hr:dm D: 11/19/2012 10:18:07 ET T: 11/19/2012 10:41:41 ET JOB#: [id number]  cc: Patrick NorthHimabindu Alyrica Thurow, MD, <Dictator> Patrick NorthHIMABINDU Ashon Rosenberg MD ELECTRONICALLY SIGNED 11/25/2012 10:45
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
## 2019-07-24 ENCOUNTER — Other Ambulatory Visit: Payer: Self-pay

## 2019-07-24 ENCOUNTER — Encounter (HOSPITAL_COMMUNITY): Payer: Self-pay | Admitting: Emergency Medicine

## 2019-07-24 ENCOUNTER — Emergency Department (HOSPITAL_COMMUNITY)
Admission: EM | Admit: 2019-07-24 | Discharge: 2019-07-24 | Disposition: A | Discharge status: Home / Self Care | Payer: Self-pay | Attending: Emergency Medicine | Admitting: Emergency Medicine

## 2019-07-24 DIAGNOSIS — R519 Headache, unspecified: Secondary | ICD-10-CM | POA: Insufficient documentation

## 2019-07-24 DIAGNOSIS — Z5321 Procedure and treatment not carried out due to patient leaving prior to being seen by health care provider: Secondary | ICD-10-CM | POA: Insufficient documentation

## 2019-07-24 LAB — BASIC METABOLIC PANEL
Anion gap: 8 (ref 5–15)
BUN: 10 mg/dL (ref 6–20)
CO2: 23 mmol/L (ref 22–32)
Calcium: 9.9 mg/dL (ref 8.9–10.3)
Chloride: 106 mmol/L (ref 98–111)
Creatinine, Ser: 0.83 mg/dL (ref 0.44–1.00)
GFR calc Af Amer: >60 mL/min (ref >60)
GFR calc non Af Amer: >60 mL/min (ref >60)
Glucose, Bld: 96 mg/dL (ref 70–99)
Potassium: 4.8 mmol/L (ref 3.5–5.1)
Sodium: 137 mmol/L (ref 135–145)

## 2019-07-24 LAB — I-STAT BETA HCG BLOOD, ED (MC, WL, AP ONLY): I-stat hCG, quantitative: <5 m[IU]/mL (ref <5)

## 2019-07-24 LAB — CBC WITH DIFFERENTIAL/PLATELET
Abs Immature Granulocytes: 0.05 10*3/uL (ref 0.00–0.07)
Basophils Absolute: 0.1 10*3/uL (ref 0.0–0.1)
Basophils Relative: 0 %
Eosinophils Absolute: 0 10*3/uL (ref 0.0–0.5)
Eosinophils Relative: 0 %
HCT: 39 % (ref 36.0–46.0)
Hemoglobin: 12.9 g/dL (ref 12.0–15.0)
Immature Granulocytes: 0 %
Lymphocytes Relative: 13 %
Lymphs Abs: 1.5 10*3/uL (ref 0.7–4.0)
MCH: 30.2 pg (ref 26.0–34.0)
MCHC: 33.1 g/dL (ref 30.0–36.0)
MCV: 91.3 fL (ref 80.0–100.0)
Monocytes Absolute: 0.6 10*3/uL (ref 0.1–1.0)
Monocytes Relative: 5 %
Neutro Abs: 10 10*3/uL — ABNORMAL HIGH (ref 1.7–7.7)
Neutrophils Relative %: 82 %
Platelets: 362 10*3/uL (ref 150–400)
RBC: 4.27 MIL/uL (ref 3.87–5.11)
RDW: 13.4 % (ref 11.5–15.5)
WBC: 12.2 10*3/uL — ABNORMAL HIGH (ref 4.0–10.5)
nRBC: 0 % (ref 0.0–0.2)
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
## 2019-07-24 NOTE — ED Notes (Signed)
Pt states she is leaving d/t long wait. Pt educated on risks of leaving, pt verbalized understanding.

## 2019-08-18 ENCOUNTER — Encounter (HOSPITAL_COMMUNITY): Payer: Self-pay | Admitting: Pediatrics

## 2019-08-18 ENCOUNTER — Emergency Department (HOSPITAL_COMMUNITY)
Admission: EM | Admit: 2019-08-18 | Discharge: 2019-08-18 | Disposition: A | Discharge status: Home / Self Care | Payer: Self-pay | Attending: Emergency Medicine | Admitting: Emergency Medicine

## 2019-08-18 ENCOUNTER — Other Ambulatory Visit: Payer: Self-pay

## 2019-08-18 DIAGNOSIS — S50851A Superficial foreign body of right forearm, initial encounter: Secondary | ICD-10-CM | POA: Insufficient documentation

## 2019-08-18 DIAGNOSIS — F17200 Nicotine dependence, unspecified, uncomplicated: Secondary | ICD-10-CM | POA: Insufficient documentation

## 2019-08-18 DIAGNOSIS — Z79899 Other long term (current) drug therapy: Secondary | ICD-10-CM | POA: Insufficient documentation

## 2019-08-18 DIAGNOSIS — Z23 Encounter for immunization: Secondary | ICD-10-CM | POA: Insufficient documentation

## 2019-08-18 DIAGNOSIS — W25XXXA Contact with sharp glass, initial encounter: Secondary | ICD-10-CM | POA: Insufficient documentation

## 2019-08-18 DIAGNOSIS — Y9389 Activity, other specified: Secondary | ICD-10-CM | POA: Insufficient documentation

## 2019-08-18 DIAGNOSIS — T148XXA Other injury of unspecified body region, initial encounter: Secondary | ICD-10-CM

## 2019-08-18 DIAGNOSIS — Y929 Unspecified place or not applicable: Secondary | ICD-10-CM | POA: Insufficient documentation

## 2019-08-18 DIAGNOSIS — Y999 Unspecified external cause status: Secondary | ICD-10-CM | POA: Insufficient documentation

## 2019-08-18 MED ORDER — TETANUS-DIPHTH-ACELL PERTUSSIS 5-2.5-18.5 LF-MCG/0.5 IM SUSP
0.5000 mL | Freq: Once | INTRAMUSCULAR | Status: AC
Start: 2019-08-18 — End: 2019-08-18
  Administered 2019-08-18: 14:00:00 0.5 mL via INTRAMUSCULAR
  Filled 2019-08-18: qty 0.5

## 2019-08-18 MED ORDER — LIDOCAINE HCL (PF) 1 % IJ SOLN
5.0000 mL | Freq: Once | INTRAMUSCULAR | Status: AC
  Administered 2019-08-18: 14:00:00 5 mL
  Filled 2019-08-18: qty 5
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
## 2019-08-18 NOTE — ED Notes (Signed)
ED Provider at bedside. 
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
## 2019-08-18 NOTE — Discharge Instructions (Addendum)
As discussed, use warm compresses a few times a day to help the foreign body get out. You may take over the counter ibuprofen or tylenol as needed for pain. Please follow-up with your PCP within the next week if it has not come out or is still causing discomfort. Return to the ER with new or worsening symptoms.

## 2020-12-31 ENCOUNTER — Inpatient Hospital Stay
Admit: 2020-12-31 | Discharge: 2020-12-31 | Disposition: A | Discharge status: Home / Self Care | Payer: PRIVATE HEALTH INSURANCE

## 2020-12-31 DIAGNOSIS — S39011A Strain of muscle, fascia and tendon of abdomen, initial encounter: Secondary | ICD-10-CM

## 2020-12-31 MED ORDER — IBUPROFEN 800 MG PO TABS
800 MG | ORAL_TABLET | Freq: Three times a day (TID) | ORAL | 0 refills | Status: AC | PRN
Start: 2020-12-31 — End: ?

## 2020-12-31 NOTE — ED Provider Notes (Signed)
[facility]  UrgentCare Encounter      CHIEFCOMPLAINT       Chief Complaint   Patient presents with    Groin Pain     Left           Nurses Notes reviewed and I agree except as noted in the HPI.  HISTORY OF PRESENT ILLNESS   Amy Pace is a 30 y.o. female who presents for evaluation of left groin pain. Patient prefers to be called Amy Pace.   He states that on 12/19/20 he noticed the pain, there has been no injury or trauma. Pain has worsened over time and with repetitive movements at work.  No urinary symptoms or abdominal pain.    Has used icy hot and ibuprofen which helped a little.  He left work to be seen today.   Patient declines Manufacturing engineer.    The patient/patient representative has no other acute complaints at this time.    REVIEW OF SYSTEMS     Review of Systems   Constitutional:  Negative for chills, fatigue and fever.   Respiratory:  Negative for cough and shortness of breath.    Cardiovascular:  Negative for chest pain.   Gastrointestinal:  Negative for abdominal pain, diarrhea, nausea and vomiting.   Genitourinary:  Negative for dysuria.   Musculoskeletal:         Left groin pain   Skin:  Negative for rash.   Allergic/Immunologic: Negative for environmental allergies and food allergies.   Neurological:  Negative for headaches.     PAST MEDICAL HISTORY         Diagnosis Date    Anxiety     Depression        SURGICAL HISTORY     Patient  has no past surgical history on file.    CURRENT MEDICATIONS       Discharge Medication List as of 12/31/2020  6:14 PM        CONTINUE these medications which have NOT CHANGED    Details   TESTOSTERONE CYPIONATE IJ Inject 0.5 mg as directed once a weekHistorical Med      hydrOXYzine Pamoate (VISTARIL PO) Take by mouthHistorical Med      hydrOXYzine HCl (ATARAX) 10 MG tablet Take 10 mg by mouth 3 times daily as needed for ItchingHistorical Med             ALLERGIES     Patient is has No Known Allergies.    FAMILY HISTORY      Patient'sfamily history is not on file.    SOCIAL HISTORY     Patient  reports that she has been smoking cigarettes. She has a 5.50 pack-year smoking history. She has never used smokeless tobacco. She reports current drug use. Drug: Marijuana Sheran Fava).    PHYSICAL EXAM     ED TRIAGE VITALS  BP: 132/71, Temp: 97.6 ??F (36.4 ??C), Heart Rate: (!) 103, Resp: 18, SpO2: 99 %  Physical Exam  Vitals and nursing note reviewed.   Constitutional:       General: She is not in acute distress.     Appearance: Normal appearance. She is well-developed and well-groomed.   HENT:      Head: Normocephalic and atraumatic.      Right Ear: External ear normal.      Left Ear: External ear normal.   Eyes:      Conjunctiva/sclera: Conjunctivae normal.      Right eye: Right conjunctiva  is not injected.      Left eye: Left conjunctiva is not injected.      Pupils: Pupils are equal.   Cardiovascular:      Rate and Rhythm: Normal rate.   Pulmonary:      Effort: Pulmonary effort is normal. No respiratory distress.   Musculoskeletal:      Cervical back: Normal range of motion.   Skin:     General: Skin is warm and dry.      Findings: No rash (on exposed surfaces).      Comments: Designer, industrial/product for exam.  Left groin normal.    Neurological:      Mental Status: She is alert and oriented to person, place, and time.      Gait: Gait is intact.   Psychiatric:         Mood and Affect: Mood normal.         Speech: Speech normal.         Behavior: Behavior normal.       DIAGNOSTIC RESULTS   Labs:  Abnormal Labs Reviewed - No data to display     IMAGING:  No orders to display     URGENT CARE COURSE:     Vitals:    12/31/20 1753   BP: 132/71   Pulse: (!) 103   Resp: 18   Temp: 97.6 ??F (36.4 ??C)   SpO2: 99%       Medications - No data to display  PROCEDURES:  FINALIMPRESSION      1. Strain of left groin        DISPOSITION/PLAN   DISPOSITION Decision To Discharge 12/31/2020 06:12:29 PM  Physical exam is unremarkable.   Recommend over-the-counter pain  relievers and monitoring symptoms at this time.        Problem List Items Addressed This Visit    None  Visit Diagnoses       Strain of left groin    -  Primary    Relevant Medications    ibuprofen (ADVIL;MOTRIN) 800 MG tablet            Physical assessment findings, diagnostic testing(s) if applicable, and vital signs reviewed with patient/patient representative.  Differential diagnosis(s) discussed with patient/patient representative. Prescription medications and/or over-the-counter medications for symptom management discussed.  Patient is to follow-up with family care provider in 2-3 days if no improvement. If symptoms should worsen or change, go to the ED. Patient/patient representative is aware of care plan, questions answered, verbalizes understanding and is in agreement.   Printed instructions attached to after visit summary.  If COVID-19 positive or COVID-19 by PCR is pending at time of discharge patient is to quarantine/isolate according to [facility] guidelines.      PATIENT REFERRED TO:  [facility]  [phone]  [place] [address]  [phone]  Schedule an appointment as soon as possible for a visit in 3 days  if you do not have a family provider, If symptoms change/worsen, go to the ER    Wynonia Sours, APRN - CNP    Please note that some or all of this chart was generated using [employer] voice recognition software. Although every effort was made to ensure the accuracy of this automated transcription, some errors in transcription may have occurred.        Wynonia Sours, APRN - CNP  12/31/20 1835

## 2020-12-31 NOTE — ED Triage Notes (Signed)
Pt to UC with c/o left groin pain. Pt reports at work on august 8th he felt his groin get painful after. Pt stated it has gotten worse over time.

## 2022-07-11 ENCOUNTER — Inpatient Hospital Stay: Admit: 2022-07-11 | Discharge: 2022-07-11 | Disposition: A | Discharge status: Home / Self Care

## 2022-07-11 DIAGNOSIS — K1120 Sialoadenitis, unspecified: Secondary | ICD-10-CM

## 2022-07-11 MED ORDER — AMOXICILLIN-POT CLAVULANATE 875-125 MG PO TABS
875-125 | ORAL_TABLET | Freq: Two times a day (BID) | ORAL | 0 refills | Status: AC
Start: 2022-07-11 — End: 2022-07-18

## 2022-07-11 NOTE — ED Provider Notes (Signed)
[facility] [place]  Urgent Care Encounter       CHIEF COMPLAINT       Chief Complaint   Patient presents with    Lymphadenopathy       Nurses Notes reviewed and I agree except as noted in the HPI.  HISTORY OF PRESENT ILLNESS   Amy Pace is a 32 y.o. female who presents with complaints of a swollen lymph node to the left side of her neck.  This is been present for the past 3 to 4 days.  Denies any recent illness including sore throat, headache, fever, cough, or runny nose.  Does admit to radiation of pain into the left ear occasionally.  Also admits to some gumline tenderness.  No treatment has been tried.  Unsure if anything makes it better or worse.    The history is provided by the patient.       REVIEW OF SYSTEMS     Review of Systems   Constitutional:  Negative for fever.   HENT:  Positive for ear pain (left). Negative for congestion, dental problem, ear discharge, facial swelling, rhinorrhea and sore throat.    Respiratory:  Negative for cough and shortness of breath.    Cardiovascular:  Negative for chest pain.   Gastrointestinal:  Negative for nausea.   Musculoskeletal:  Positive for neck pain.   Neurological:  Negative for dizziness, weakness and headaches.   Hematological:  Positive for adenopathy (left cervical).       PAST MEDICAL HISTORY         Diagnosis Date    Anxiety     Depression        SURGICALHISTORY     Patient  has no past surgical history on file.    CURRENT MEDICATIONS       Previous Medications    ASPIRIN 325 MG TABLET    Take 1 tablet by mouth daily    IBUPROFEN (ADVIL;MOTRIN) 800 MG TABLET    Take 1 tablet by mouth every 8 hours as needed for Pain    TESTOSTERONE CYPIONATE IJ    Inject 0.5 mg as directed once a week       ALLERGIES     Patient is has No Known Allergies.    Patients   There is no immunization history on file for this patient.    FAMILY HISTORY     Patient's family history is not on file.    SOCIAL HISTORY     Patient  reports that she has been  smoking cigarettes. She has a 5.5 pack-year smoking history. She has never used smokeless tobacco. She reports that she does not currently use alcohol. She reports current drug use. Drug: Marijuana Sherrie Mustache).    PHYSICAL EXAM     ED TRIAGE VITALS  BP: 122/73, Temp: 99 F (37.2 C), Pulse: 95, Respirations: 16, SpO2: 99 %,There is no height or weight on file to calculate BMI.,No LMP recorded. (Menstrual status: Other - See Notes).    Physical Exam  Vitals and nursing note reviewed.   Constitutional:       General: She is not in acute distress.     Appearance: She is not ill-appearing.   HENT:      Right Ear: Tympanic membrane, ear canal and external ear normal.      Left Ear: Tympanic membrane, ear canal and external ear normal.      Nose: No congestion or rhinorrhea.      Mouth/Throat:  Mouth: Mucous membranes are moist.      Dentition: Dental tenderness (left gums) present. No gingival swelling or gum lesions.      Pharynx: No posterior oropharyngeal erythema.   Cardiovascular:      Rate and Rhythm: Normal rate and regular rhythm.      Heart sounds: Normal heart sounds.   Pulmonary:      Effort: Pulmonary effort is normal.      Breath sounds: Normal breath sounds.   Musculoskeletal:      Cervical back: Tenderness present.   Lymphadenopathy:      Cervical: Cervical adenopathy present.   Skin:     General: Skin is warm and dry.   Neurological:      Mental Status: She is alert and oriented to person, place, and time.      Motor: No weakness.         DIAGNOSTIC RESULTS     Labs:No results found for this visit on 07/11/22.    IMAGING:  None    EKG:  None    URGENT CARE COURSE:     Vitals:    07/11/22 1008   BP: 122/73   Pulse: 95   Resp: 16   Temp: 99 F (37.2 C)   SpO2: 99%   Weight: 65.8 kg (145 lb)       Medications - No data to display       PROCEDURES:  None    FINAL IMPRESSION      1. Salivary gland adenitis      DISPOSITION/ PLAN   DISPOSITION Decision To Discharge 07/11/2022 10:34:54 AM     Clinical exam  consistent with salivary gland adenitis.  Will treat with Augmentin.  Advised to take over-the-counter Tylenol and ibuprofen.  Follow-up with PCP for reevaluation if necessary.    PATIENT REFERRED TO:  No primary care provider on file.  No primary physician on file.      DISCHARGE MEDICATIONS:  New Prescriptions    AMOXICILLIN-CLAVULANATE (AUGMENTIN) 875-125 MG PER TABLET    Take 1 tablet by mouth 2 times daily for 7 days       Discontinued Medications    HYDROXYZINE HCL (ATARAX) 10 MG TABLET    Take 10 mg by mouth 3 times daily as needed for Itching    HYDROXYZINE PAMOATE (VISTARIL PO)    Take by mouth       Current Discharge Medication List          Lorelei Pont, APRN - CNP    (Please note that portions of this note were completed with a voice recognition program. Efforts were made to edit the dictations but occasionally words are mis-transcribed.)            Lorelei Pont, APRN - CNP  07/11/22 1045

## 2022-07-11 NOTE — ED Triage Notes (Addendum)
To room with c/o left anterior cervical lymph node swelling x 1 week. Pain in left ear with swallowing. Some irritations in gums.

## 2024-02-01 DIAGNOSIS — R002 Palpitations: Secondary | ICD-10-CM

## 2024-02-01 NOTE — ED Triage Notes (Signed)
 Pt to ED from home with c/o Panic attack. Pt reports that she was recently prescribed citalopram and Abilify ,. Pt reports that after she took the medication tonight she started having heart palpations. Pt states that she started to panic and called EMS. EMS gave 2 Mg of ativan in route. Pt reports that she is feeling better after the Ativan. VSS. Pt is alert and oriented upon arrival.

## 2024-02-01 NOTE — ED Notes (Signed)
 Pt resting in bed, VSS, Pt voices that he is feeling better. No concerns voiced at this time.

## 2024-02-01 NOTE — ED Provider Notes (Signed)
 EMERGENCY DEPARTMENT ENCOUNTER     CHIEF COMPLAINT   Chief Complaint   Patient presents with    Panic Attack        HPI   Amy Pace is a 33 y.o. female biologically but identified as female who presents with palpitations and he describes the quality as racing heart/thumping for about 12-13 times per minute, since the onset this evening without any triggers. The patient has had associated recent new medications including Abilify (two weeks) and Celexa (2 days). Pt denies psychosis, chest pain or sob. He called EMS who gave him 2mg  of IV ativan prior to arrival. When asked how he's doing, pt's symptoms have improved significantly.    REVIEW OF SYSTEMS   All other review of systems are reviewed and are otherwise negative.    PAST MEDICAL & SURGICAL HISTORY   Past Medical History:   Diagnosis Date    Anxiety     Depression      History reviewed. No pertinent surgical history.   CURRENT MEDICATIONS   Current Outpatient Rx   Medication Sig Dispense Refill    aspirin 325 MG tablet Take 1 tablet by mouth daily      TESTOSTERONE CYPIONATE IJ Inject 0.5 mg as directed once a week      ibuprofen  (ADVIL ;MOTRIN ) 800 MG tablet Take 1 tablet by mouth every 8 hours as needed for Pain 30 tablet 0      ALLERGIES   No Known Allergies   SOCIAL & FAMILY HISTORY   Social History     Socioeconomic History    Marital status: Single     Spouse name: None    Number of children: None    Years of education: None    Highest education level: None   Tobacco Use    Smoking status: Every Day     Current packs/day: 0.50     Average packs/day: 0.5 packs/day for 11.0 years (5.5 ttl pk-yrs)     Types: Cigarettes    Smokeless tobacco: Never   Substance and Sexual Activity    Alcohol use: Not Currently    Drug use: Yes     Types: Marijuana Oda)     History reviewed. No pertinent family history.     PHYSICAL EXAM   VITAL SIGNS: BP 103/61   Pulse 91   Temp 98.9 F (37.2 C) (Oral)   Resp 17   Ht 1.676 m (5' 6)   Wt 68 kg (150 lb)   SpO2 97%    BMI 24.21 kg/m    Constitutional: Well developed, well nourished, no acute distress   HENT: Atraumatic, moist mucus membranes  Respiratory: Lungs Clear, no retractions   Cardiovascular: RRR, no m/r/g  Vascular: Radial pulses 2+ equal bilaterally  Musculoskeletal: No edema, no deformities  Integument: Skin warm and dry, no petechiae   Neurologic: Alert & oriented, normal speech  Psych: Pleasant affect, no hallucinations     EKG (interpreted by me)  Interpreted by emergency department physician on 02/01/24 21:58    Rhythm: normal sinus   Rate: 103 bpm  Axis: normal  Ectopy: none  Conduction: normal  ST Segments: no acute change  T Waves: no acute change  Q Waves: none    Clinical Impression: non-specific EKG; NSR without ischemic changes    RADIOLOGY/PROCEDURES   No orders to display       Labs Reviewed   COMPREHENSIVE METABOLIC PANEL - Abnormal; Notable for the following components:  Result Value    Glucose 112 (*)     Potassium 3.3 (*)     CO2 20 (*)     All other components within normal limits   CBC WITH AUTO DIFFERENTIAL   ANION GAP   GLOMERULAR FILTRATION RATE, ESTIMATED   OSMOLALITY       ED COURSE & MEDICAL DECISION MAKING   This is a pleasant 33 yo self-identified female patient who presented to the ED with a bout of palpitations preceded by anxiety reaction. His labs and presenting EKG were wnl.    Pertinent Labs & Imaging studies reviewed and interpreted. (See chart for details)  See chart for details of medications given during the ED stay.   Vitals:    02/01/24 2236   BP: 103/61   Pulse: 91   Resp: 17   Temp:    SpO2: 97%       Differential Diagnosis: Anxiety, panic attack; Tachyarrhythmias such as SVT, rapid A. Fib, ventricular tachycardia, other.     I observed pt for a while, and with his continued improvement; I have low suspicion for SVT or A-fib or any dangerous tachyarrhythmia. Moreover, it's unlikely his symptoms were caused by his recent medications. Via shared decision making, pt will be  discharged home with anticipatory guidance.    FINAL IMPRESSION   1. Palpitations         FINAL DISPOSITION: Discharge home with anticipatory guidance.    Electronically signed by: LYNWOOD GORMAN DONALDS, MD, 02/01/2024  11:49 PM    (This note was completed with a voice recognition program)        DONALDS LYNWOOD GORMAN, MD  02/01/24 2349

## 2024-02-01 NOTE — Discharge Instructions (Signed)
 PLEASE FOLLOW UP WITH YOUR DOCTOR SOON.

## 2024-02-02 ENCOUNTER — Inpatient Hospital Stay
Admit: 2024-02-02 | Discharge: 2024-02-02 | Disposition: A | Discharge status: Home / Self Care | Arrived: AM | Attending: Emergency Medicine

## 2024-02-02 LAB — CBC WITH AUTO DIFFERENTIAL
Basophils Absolute: 0.1 thou/mm3 (ref 0.0–0.1)
Basophils: 0.8 %
Eosinophils Absolute: 0 thou/mm3 (ref 0.0–0.4)
Eosinophils: 0.4 %
Hematocrit: 39.1 % (ref 37.0–47.0)
Hemoglobin: 13.2 g/dL (ref 12.0–16.0)
Immature Grans (Abs): 0.02 thou/mm3 (ref 0.00–0.07)
Immature Granulocytes %: 0.2 %
Lymphocytes Absolute: 3.3 thou/mm3 (ref 1.0–4.8)
Lymphocytes: 38.6 %
MCH: 30 pg (ref 26.0–33.0)
MCHC: 33.8 g/dL (ref 32.2–35.5)
MCV: 88.9 fL (ref 81.0–99.0)
MPV: 10 fL (ref 9.4–12.4)
Monocytes %: 8.2 %
Monocytes Absolute: 0.7 thou/mm3 (ref 0.4–1.3)
Neutrophils Absolute: 4.4 thou/mm3 (ref 1.8–7.7)
Platelets: 293 thou/mm3 (ref 130–400)
RBC: 4.4 mill/mm3 (ref 4.20–5.40)
RDW-CV: 13.1 % (ref 11.5–14.5)
RDW-SD: 43 fL (ref 35.0–45.0)
Seg Neutrophils: 51.8 %
WBC: 8.5 thou/mm3 (ref 4.8–10.8)
nRBC: 0 /100{WBCs}

## 2024-02-02 LAB — COMPREHENSIVE METABOLIC PANEL
ALT: 10 U/L (ref 10–35)
AST: 19 U/L (ref 10–35)
Albumin: 4.3 g/dL (ref 3.4–4.9)
Alkaline Phosphatase: 63 U/L (ref 38–126)
BUN: 15 mg/dL (ref 8–23)
CO2: 20 meq/L — ABNORMAL LOW (ref 22–29)
Calcium: 9.1 mg/dL (ref 8.5–10.5)
Chloride: 105 meq/L (ref 98–111)
Creatinine: 0.9 mg/dL (ref 0.5–0.9)
Glucose: 112 mg/dL — ABNORMAL HIGH (ref 74–109)
Potassium: 3.3 meq/L — ABNORMAL LOW (ref 3.5–5.2)
Sodium: 139 meq/L (ref 135–145)
Total Bilirubin: 0.4 mg/dL (ref 0.3–1.2)
Total Protein: 7 g/dL (ref 6.4–8.3)

## 2024-02-02 LAB — GLOMERULAR FILTRATION RATE, ESTIMATED: Est, Glom Filt Rate: 85 ml/min/1.73m2 (ref >60)

## 2024-02-02 LAB — ANION GAP: Anion Gap: 14 meq/L (ref 8.0–16.0)

## 2024-02-02 LAB — OSMOLALITY: Osmolality Calc: 279.1 mosm/kg (ref 275.0–300.0)

## 2024-02-03 LAB — EKG 12-LEAD
Atrial Rate: 103 {beats}/min
P Axis: 50 degrees
P-R Interval: 138 ms
Q-T Interval: 320 ms
QRS Duration: 78 ms
QTc Calculation (Bazett): 419 ms
R Axis: 20 degrees
T Axis: 47 degrees
Ventricular Rate: 103 {beats}/min

## 2024-02-06 ENCOUNTER — Inpatient Hospital Stay
Admit: 2024-02-06 | Discharge: 2024-02-06 | Disposition: A | Discharge status: Home / Self Care | Arrived: AM | Attending: Student in an Organized Health Care Education/Training Program

## 2024-02-06 DIAGNOSIS — F410 Panic disorder [episodic paroxysmal anxiety] without agoraphobia: Principal | ICD-10-CM

## 2024-02-06 LAB — D-DIMER, QUANTITATIVE: D-Dimer, Quant: 267 ng{FEU}/mL (ref 0.00–500.00)

## 2024-02-06 LAB — TROPONIN: Troponin, High Sensitivity: <6 ng/L (ref 0–12)

## 2024-02-06 LAB — CBC WITH AUTO DIFFERENTIAL
Basophils Absolute: 0.1 thou/mm3 (ref 0.0–0.1)
Basophils: 0.6 %
Eosinophils Absolute: 0 thou/mm3 (ref 0.0–0.4)
Eosinophils: 0.1 %
Hematocrit: 39.1 % (ref 37.0–47.0)
Hemoglobin: 13.4 g/dL (ref 12.0–16.0)
Immature Grans (Abs): 0.05 thou/mm3 (ref 0.00–0.07)
Immature Granulocytes %: 0.5 %
Lymphocytes Absolute: 3 thou/mm3 (ref 1.0–4.8)
Lymphocytes: 30.9 %
MCH: 29.7 pg (ref 26.0–33.0)
MCHC: 34.3 g/dL (ref 32.2–35.5)
MCV: 86.7 fL (ref 81.0–99.0)
MPV: 10.8 fL (ref 9.4–12.4)
Monocytes %: 7.1 %
Monocytes Absolute: 0.7 thou/mm3 (ref 0.4–1.3)
Neutrophils Absolute: 5.8 thou/mm3 (ref 1.8–7.7)
Platelets: 357 thou/mm3 (ref 130–400)
RBC: 4.51 mill/mm3 (ref 4.20–5.40)
RDW-CV: 12.9 % (ref 11.5–14.5)
RDW-SD: 40.5 fL (ref 35.0–45.0)
Seg Neutrophils: 60.8 %
WBC: 9.6 thou/mm3 (ref 4.8–10.8)
nRBC: 0 /100{WBCs}

## 2024-02-06 LAB — EKG 12-LEAD
Atrial Rate: 108 {beats}/min
P Axis: 52 degrees
P-R Interval: 118 ms
Q-T Interval: 316 ms
QRS Duration: 78 ms
QTc Calculation (Bazett): 423 ms
R Axis: 25 degrees
T Axis: 48 degrees
Ventricular Rate: 108 {beats}/min

## 2024-02-06 LAB — BASIC METABOLIC PANEL
BUN: 10 mg/dL (ref 8–23)
CO2: 21 meq/L — ABNORMAL LOW (ref 22–29)
Calcium: 9.4 mg/dL (ref 8.5–10.5)
Chloride: 103 meq/L (ref 98–111)
Creatinine: 0.7 mg/dL (ref 0.5–0.9)
Glucose: 95 mg/dL (ref 74–109)
Potassium: 3.3 meq/L — ABNORMAL LOW (ref 3.5–5.2)
Sodium: 139 meq/L (ref 135–145)

## 2024-02-06 LAB — ANION GAP: Anion Gap: 15 meq/L (ref 8.0–16.0)

## 2024-02-06 LAB — GLOMERULAR FILTRATION RATE, ESTIMATED: Est, Glom Filt Rate: >90 ml/min/1.73m2 (ref >60)

## 2024-02-06 LAB — OSMOLALITY: Osmolality Calc: 276.4 mosm/kg (ref 275.0–300.0)

## 2024-02-06 LAB — TSH REFLEX TO FT4: TSH: 0.82 u[IU]/mL (ref 0.27–4.20)

## 2024-02-06 NOTE — ED Provider Notes (Signed)
 [facility]  EMERGENCY DEPARTMENT ENCOUNTER          Pt Name: Amy Pace  MRN: [id number]  Birthdate [birth year]  Date of evaluation: 02/06/2024  Physician: Tyronne DELENA Conn, DO  Supervising Attending Physician: Darleene Jayson MATSU, MD       CHIEF COMPLAINT       Chief Complaint   Patient presents with    Panic Attack     States frequent panic attacks         HISTORY OF PRESENT ILLNESS    33 yo female who identifies as female presents to the ED for panic attack. He was laying down when he felt palpitations come on and then got very anxious and started having pain in her chest so he called 911 and was brought here by EMS. he is no longer having any chest pain. His psychiatrist had her her on citalopram and she started having palpitations so he changed his medication to zoflot. He is also on hydroxyzine and Abilify and trazodone for sleep. He is been on buspar before but it was not helpful. Pt was in the ED 5 days ago for a panic attack and was suspect to have palpitations he was observed and then discharged.      The history is provided by the patient.     Amy Pace is a 33 y.o. adult who presents to the emergency department from home for evaluation of panic attack     Denies fever, chills, headache, lightheadedness, dizziness, vision changes, tinnitus, cough, congestion, sore throat, neck pain/stiffness, chest pain, shortness of breath, abdominal pain, nausea, vomiting, constipation, diarrhea, dysuria, blood in the urine or stool, numbness/tingling/weakness in extremities.    The patient has no other acute complaints at this time.      PAST MEDICAL AND SURGICAL HISTORY     Past Medical History:   Diagnosis Date    Anxiety     Depression      No past surgical history on file.      MEDICATIONS   No current facility-administered medications for this encounter.    Current Outpatient Medications:     ARIPiprazole (ABILIFY) 10 MG tablet, Take 1 tablet by mouth daily, Disp: ,  Rfl:     sertraline (ZOLOFT) 25 MG tablet, Take 1 tablet by mouth daily, Disp: , Rfl:     hydrOXYzine HCl (ATARAX) 50 MG tablet, Take 1 tablet by mouth 3 times daily as needed for Itching, Disp: , Rfl:     traZODone (DESYREL) 50 MG tablet, Take 1 tablet by mouth nightly, Disp: , Rfl:     aspirin 325 MG tablet, Take 1 tablet by mouth daily, Disp: , Rfl:     TESTOSTERONE CYPIONATE IJ, Inject 0.5 mg as directed once a week, Disp: , Rfl:     ibuprofen  (ADVIL ;MOTRIN ) 800 MG tablet, Take 1 tablet by mouth every 8 hours as needed for Pain, Disp: 30 tablet, Rfl: 0    Previous Medications    ARIPIPRAZOLE (ABILIFY) 10 MG TABLET    Take 1 tablet by mouth daily    ASPIRIN 325 MG TABLET    Take 1 tablet by mouth daily    HYDROXYZINE HCL (ATARAX) 50 MG TABLET    Take 1 tablet by mouth 3 times daily as needed for Itching    IBUPROFEN  (ADVIL ;MOTRIN ) 800 MG TABLET    Take 1 tablet by mouth every 8 hours as needed for Pain  SERTRALINE (ZOLOFT) 25 MG TABLET    Take 1 tablet by mouth daily    TESTOSTERONE CYPIONATE IJ    Inject 0.5 mg as directed once a week    TRAZODONE (DESYREL) 50 MG TABLET    Take 1 tablet by mouth nightly         SOCIAL HISTORY     Social History     Social History Narrative    Not on file     Social History     Tobacco Use    Smoking status: Every Day     Current packs/day: 0.50     Average packs/day: 0.5 packs/day for 11.0 years (5.5 ttl pk-yrs)     Types: Cigarettes    Smokeless tobacco: Never   Substance Use Topics    Alcohol use: Not Currently    Drug use: Yes     Types: Marijuana (Weed)         ALLERGIES   No Known Allergies      FAMILY HISTORY   No family history on file.      PHYSICAL EXAM     ED Triage Vitals [02/06/24 1628]   BP Girls Systolic BP Percentile Girls Diastolic BP Percentile Boys Systolic BP Percentile Boys Diastolic BP Percentile Temp Temp Source Pulse   (!) 150/92 -- -- -- -- 98.7 F (37.1 C) Oral (!) 118      Respirations SpO2 Height Weight - Scale       20 98 % -- 65.8 kg (145 lb)          Initial vital signs and nursing assessment reviewed. Body mass index is 23.4 kg/m.     Additional Vital Signs:  Vitals:    02/06/24 1819   BP: (!) 141/76   Pulse: 80   Resp: 16   Temp:    SpO2: 98%       Physical Exam  Vitals reviewed.   Constitutional:       Appearance: Normal appearance. He is not ill-appearing.   HENT:      Head: Normocephalic and atraumatic.      Nose: Nose normal.   Eyes:      Extraocular Movements: Extraocular movements intact.      Conjunctiva/sclera: Conjunctivae normal.      Pupils: Pupils are equal, round, and reactive to light.   Cardiovascular:      Rate and Rhythm: Normal rate and regular rhythm.      Pulses: Normal pulses.      Heart sounds: Normal heart sounds.   Pulmonary:      Effort: Pulmonary effort is normal. No respiratory distress.      Breath sounds: Normal breath sounds. No wheezing or rales.   Neurological:      Mental Status: He is alert.         ED RESULTS   Laboratory results (none if blank):  Labs Reviewed   BASIC METABOLIC PANEL - Abnormal; Notable for the following components:       Result Value    Potassium 3.3 (*)     CO2 21 (*)     All other components within normal limits   TSH REFLEX TO FT4   ANION GAP   GLOMERULAR FILTRATION RATE, ESTIMATED   OSMOLALITY   CBC WITH AUTO DIFFERENTIAL   D-DIMER, QUANTITATIVE   TROPONIN     All laboratory results are individually reviewed and interpreted by me in the clinical context of this patient.  See ED course below for results  interpretation if applicable.  (A negative COVID-19 test should be interpreted as COVID no longer suspected unless otherwise noted in this encounter documentation note)  (Any cultures that may have been sent were not resulted at the time of this patient ED visit)      Radiologic studies results available at the moment of this note (None if blank):  No orders to display     See ED course below for my interpretation if applicable.  All radiology images independently reviewed by me in the clinical  context of this patient, in addition to interpretation provided by the radiologist.      EKG interpretation:  See ED course (if applicable) [none if blank]  All EKG results are individually reviewed and interpreted by me in the clinical context of this patient.  All EKGs are also interpreted by our Cardiology department, final interpretation may not be available as of the writing of this note.      PREVIOUS RECORDS  AND EXTERNAL INFORMATION REVIEWED   History obtained from: the patient.  Pertinent previous and/or external records reviewed: pt was in ED on 9/20 .  Case discussed with specialties other than Emergency Medicine: Not Applicable      MEDICAL DECISION MAKING   Differential Diagnosis includes but is not limited to:  Tachyrhythmia   Palpitations   Panic attack anxiety    Hyperthyroidism     Decision Rules/Clinical Scores utilized:    Not Applicable    MIPS documentation:  N/A    Medical Decision Making  33 yo female who identifies as a female presents to the ED for panic attack. Was previously seen in the ED for similar episode was observed and discharged home in stable condition. Pt concerned about her potassium will repeat bmp and check TSH at this time to rule out thyroid pathology as a cause of her palpitations.  Labs within normal limit. Negative troponin. Symptoms have completely resolved. Shared decision making to discharge home in stable condition with PCP follow up and psychiatrist follow-up.      Vitals stable. Physical exam unremarkable. Labs, EKG sinus tachycardia obtained - discussed in ED course Results discussed with patient. Symptoms likely 2/2 panic attack versus anxiety. Patient verbalized understanding and agreement to plan - in stable condition. Refer to ED course for additional information.      ED COURSE   ED Medications administered this visit (None if left blank):   Medications - No data to display    ED Course as of 02/06/24 1852   Thu Feb 06, 2024   1811 EKG Emergency  No changes on  EKG from last EKG some sinus tachycardia  [JS]   1817 Potassium(!): 3.3  Potassium slightly low  [JS]   1818 TSH wnl [JS]   1818 BP: 130/83 [JS]   1834 Call to lab [SC]   1835 Last T 3 weeks ago. Was getting through [employer] online. D-dimer.  [SC]   1837 D-Dimer, Quantitative:    D-Dimer, Quant 267.00  D-dimer wnl  [JS]   1848 Troponin:    Troponin, High Sensitivity < 6  Negative troponin  [JS]      ED Course User Index  [JS] Ausar Georgiou, Tyronne LABOR, DO  [SC] Darleene Jayson MATSU, MD       PROCEDURES: (None if blank)  Procedures:     CRITICAL CARE:  None    MEDICATION CHANGES     New Prescriptions    No medications on file  FINAL DISPOSITION   Shared Decision-Making was performed, disposition discussed with the patient/family and questions answered.    Outpatient follow up (If applicable):  Cleone Katrinka BROCKS, DO  [address]  [place] [address]  [phone]    Go on 02/11/2024      [facility]  [address]  [place]  [address]  [phone]  Go to   If symptoms worsen    The results of pertinent diagnostic studies and exam findings were discussed with the patient/surrogate. The patient's provisional diagnosis and plan of care were discussed with the patient and present family who expressed understanding. Medications were reviewed and indications and risks of medications were discussed with the patient/family present. Strict return precautions and follow up instructions were discussed with the patient prior to discharge, with which the patient agrees.          FINAL DIAGNOSES:  Final diagnoses:   Panic attack       Condition: Stable  Dispo: Discharge to home  DISPOSITION Decision To Discharge 02/06/2024 06:50:39 PM   DISPOSITION CONDITION Stable           This transcription was electronically signed. It was dictated by use of voice recognition software and electronically transcribed. The transcription may contain errors not detected in proofreading.

## 2024-02-06 NOTE — ED Notes (Signed)
Lab called to run add on orders

## 2024-02-06 NOTE — ED Notes (Signed)
 Pt restful on cart in position of comfort watching TV- VSS. Requested the IV be removed so they can 'relax' better.

## 2024-02-06 NOTE — ED Notes (Signed)
 Pt to rm 12 per EMS- pt states continued panic attacks, was see in ED a couple days ago for same, states they were given ativan at that time and that's what helps. PCP placed pt on new medications this week as well but didn't prescribe anything for immediate relief of symptoms. Pt states they were getting ready to go to work tonight when this panic attack started up again so they will need a work slip. On arrival pt appears in no acute distress, VSS.  Pt denies SI/HI ideations, reports increased stress r/t a sick grandmother and 'financial issues'. EKG and assessment complete.

## 2024-02-06 NOTE — Discharge Instructions (Signed)
 You were seen in the ED today for panic attack and palpitations. Labs were within normal limits. Please follow-up with PCP and OP psychiatrist. If symptoms worsen please return to the ED.

## 2024-02-11 ENCOUNTER — Encounter

## 2024-03-02 ENCOUNTER — Encounter

## 2024-03-02 ENCOUNTER — Ambulatory Visit: Admit: 2024-03-02 | Discharge: 2024-03-02

## 2024-03-02 NOTE — Progress Notes (Signed)
 "  Amy Pace (DOB:  [birth year]) is a 33 y.o. adult,New patient, here for evaluation of the following chief complaint(s):  Other (Patient was seen in [facility] ER on 02/01/2024 and 02/06/2024. Patient states that he was seen for palpitations, states that he still has them. Wants to discuss test results and potassium levels.) and Discuss Medications (Testosterone)     Assessment & Plan  Anxiety   Patient was recently seen twice in the ED on 9/20 and 9/25 for palpitations. There is concern that they could be associated with anxiety vs medication side effect. Patient does have diagnosed anxiety and follows with [facility]. They are adjusting his medications at this time.   Continue to follow with [facility] for psychiatric care        Hypokalemia   Patient was found to be hypokalemic with potassium of 3.3 while in the ED. He was discharged with instructions to increase potassium in his diet. We will get a repeat CMP and determine if he requires potassium replacement.   CMP pending - if potassium still low will initiate oral potassium supplementation        Female-to-female transgender person   Patient has been on testosterone prescribed through a web-based platform. He is concerned that the testosterone could be causing his palpitations. He currently does not see a specialist for his care. We will get labs in order to test his hormone levels and then he will establish care with [facility] in order to get gender affirming care.   Establish care with [facility] for gender affirming care   Orders:    CBC with Auto Differential; Future    Comprehensive Metabolic Panel; Future    Testosterone and SHBG; Future    Estrogens, Fractionated; Future    Lipid Panel; Future    Follow up in 3 months       Subjective   HPI    Amy is a 33 y/o FTM with PMHx of anxiety who presents to the clinic today for ED follow up. He was seen on 02/01/24 and 02/06/24 for chest pain which was attributed to panic attacks/anxiety. He was discharged home both times. His  EKG and troponins were unremarkable. There was no concern for cardiac ischemia. Patient was hypertensive in the ED. He also was noted to have hypokalemia.     Patient was seen in the ED for palpitations secondary to suspected anxiety.  He has noticed that he has had more palpitations especially related to his testosterone.  After being off the testosterone the palpitations ceased.  This is likely due to the side effect of the testosterone.  He also gets mental health services from [facility] and they are working on adjusting his medication at this time.  Overall he feels that he is making progress on his mental health.    In the ED patient was found to be hypokalemic and instructed to increase his dietary potassium.  He has been working on incorporating potatoes and bananas into his diet.  We will recheck his electrolytes today.  Patient understands that if his potassium is still low we will likely need oral supplementation.    Patient is female to female and has been using online services to obtain injectable testosterone.  He is wondering if possibly switching from injectable to transdermal or oral testosterone would decrease the likelihood of his palpitations.  Discussed that the palpitations are likely dose-dependent and not rule out dependent.  Provided him with information about gender affirming care clinics in the area that  he can get his hormones from.  We will continue to monitor his health and see him for regular health maintenance.      Review of Systems   Constitutional: Negative.    HENT: Negative.     Respiratory: Negative.     Cardiovascular: Negative.    Gastrointestinal: Negative.    Endocrine: Negative.    Neurological: Negative.    Hematological: Negative.    Psychiatric/Behavioral: Negative.            Objective   Physical Exam  Vitals reviewed.   Constitutional:       General: He is not in acute distress.     Appearance: Normal appearance. He is normal weight. He is not ill-appearing.   HENT:       Head: Normocephalic and atraumatic.      Mouth/Throat:      Mouth: Mucous membranes are moist.   Eyes:      Extraocular Movements: Extraocular movements intact.   Cardiovascular:      Rate and Rhythm: Normal rate and regular rhythm.   Pulmonary:      Effort: Pulmonary effort is normal. No respiratory distress.      Breath sounds: Normal breath sounds.   Abdominal:      General: Abdomen is flat. There is no distension.      Palpations: Abdomen is soft.   Musculoskeletal:         General: Normal range of motion.   Skin:     General: Skin is warm and dry.   Neurological:      General: No focal deficit present.      Mental Status: He is alert and oriented to person, place, and time. Mental status is at baseline.   Psychiatric:         Mood and Affect: Mood normal.         Behavior: Behavior normal.         Thought Content: Thought content normal.         Judgment: Judgment normal.              An electronic signature was used to authenticate this note.    --Asberry KANDICE Graft, MD   "

## 2024-03-02 NOTE — Progress Notes (Signed)
 "S: 33 y.o. adult with   Chief Complaint   Patient presents with    Other     Patient was seen in [facility] ER on 02/01/2024 and 02/06/2024. Patient states that she was seen for palpitations, states that she still has them. Wants to discuss test results and potassium levels.    Discuss Medications     Testosterone       HPI: please see resident note for HPI and ROS.    BP Readings from Last 3 Encounters:   03/02/24 108/68   02/06/24 (!) 141/76   02/01/24 103/61     Wt Readings from Last 3 Encounters:   03/02/24 68.1 kg (150 lb 3.2 oz)   02/06/24 65.8 kg (145 lb)   02/01/24 68 kg (150 lb)       O: VS:  height is 1.676 m (5' 6) and weight is 68.1 kg (150 lb 3.2 oz). His oral temperature is 98.1 F (36.7 C). His blood pressure is 108/68 and his pulse is 86. His respiration is 16.   AAO/NAD, appropriate affect for mood  CV:  RRR, no murmur  Resp: CTAB     Diagnosis Orders   1. Anxiety        2. Hypokalemia        3. Female-to-female transgender person  CBC with Auto Differential    Comprehensive Metabolic Panel    Testosterone and SHBG    Estrogens, Fractionated    Lipid Panel          Plan:  Please refer to resident note for full plan.    32 year old female presents to the office for er follow up and to establish as a new patient.    Anxiety: Chronic, history of.  Currently on trazodone, Zoloft, hydroxyzine.  Will continue plan to plan to follow-up to determine whether patient needs adjustment.  Is currently following up with [facility] for psychiatry and counseling.  Also plan to follow-up with them and will adjust if needed/cannot get in with psychiatry.    Gender dysphoria.  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  Currently on testosterone replacement therapy 50 mg weekly through IM injection from an online source.  Interested in transitioning to care here.  Will plan to initiate with laboratory evaluation and follow-up to talk about possible management      Health  Maintenance Due   Topic Date Due    Varicella vaccine (1 of 2 - 13+ 2-dose series) Never done    HIV screen  Never done    Hepatitis C screen  Never done    Hepatitis B vaccine (1 of 3 - 19+ 3-dose series) Never done    Pneumococcal 0-49 years Vaccine (1 of 2 - PCV) Never done    Polio vaccine (2 of 3 - Adult catch-up series) 06/18/2008    DTaP/Tdap/Td vaccine (2 - Td or Tdap) 04/09/2018    Cervical cancer screen  Never done    Flu vaccine (1) 12/13/2023    COVID-19 Vaccine (1 - 2024-25 season) Never done       Attending Physician Statement  I have discussed the case, including pertinent history and exam findings with the resident. I also have seen the patient and performed key portions of the examination.  I agree with the documented assessment and plan as documented by the resident.    gc    Evalene JONETTA Rendell Mickey., DO 03/04/2024 9:39 AM    "

## 2024-05-01 NOTE — Telephone Encounter (Signed)
"  06/03/24 appt needs rescheduled d/t provider out of office. VM left for patient as well.  "

## 2024-06-03 ENCOUNTER — Encounter
